# Patient Record
Sex: Female | Born: 1935
Health system: Southern US, Academic
[De-identification: ages and names within clinical notes are randomized; demographics above are authoritative.]

## PROBLEM LIST (undated history)

## (undated) ENCOUNTER — Encounter

## (undated) ENCOUNTER — Telehealth

## (undated) DIAGNOSIS — E119 Type 2 diabetes mellitus without complications: Secondary | ICD-10-CM

## (undated) DIAGNOSIS — I10 Essential (primary) hypertension: Secondary | ICD-10-CM

## (undated) HISTORY — PX: CHOLECYSTECTOMY: SHX55

## (undated) HISTORY — PX: JOINT REPLACEMENT: SHX530

---

## 2017-04-30 MED ORDER — MOMETASONE FUROATE 0.1 % EX CREA: Start: 2017-04-30 — End: 2017-05-02

## 2017-04-30 MED ORDER — ACCU-CHEK AVIVA PLUS VI STRP
ORAL_STRIP
Start: 2017-04-30 — End: ?

## 2017-04-30 MED ORDER — ACCU-CHEK FASTCLIX LANCETS MISC: Start: 2017-04-30 — End: ?

## 2017-04-30 MED ORDER — METFORMIN HCL ER 500 MG PO TB24: Start: 2017-04-30 — End: ?

## 2017-04-30 MED ORDER — METOPROLOL SUCCINATE ER 100 MG PO TB24: Start: 2017-04-30 — End: ?

## 2017-05-02 ENCOUNTER — Ambulatory Visit: Attending: Internal Medicine | Primary: Internal Medicine

## 2017-05-02 DIAGNOSIS — K580 Irritable bowel syndrome with diarrhea: Secondary | ICD-10-CM

## 2017-05-02 DIAGNOSIS — M1990 Unspecified osteoarthritis, unspecified site: Principal | ICD-10-CM

## 2017-05-02 DIAGNOSIS — I10 Essential (primary) hypertension: Secondary | ICD-10-CM

## 2017-05-02 DIAGNOSIS — R1010 Upper abdominal pain, unspecified: Principal | ICD-10-CM

## 2017-05-02 DIAGNOSIS — E119 Type 2 diabetes mellitus without complications: Secondary | ICD-10-CM

## 2017-05-02 MED ORDER — HYDROCHLOROTHIAZIDE 12.5 MG PO CAPS: Start: 2017-05-02 — End: ?

## 2017-05-02 MED ORDER — ALPRAZOLAM 0.25 MG PO TABS: Start: 2017-05-02 — End: ?

## 2017-05-10 NOTE — Progress Notes
[facility]    Patient: Peggy Wong  MRN: [id number]  DOB: 09/02/1936  PCP: Patient Care Team:  Harrell LarkSoberano, Celeste S., MD as PCP - General (Internal Medicine)      Chief Complaint   Patient presents with   ? Follow-up         Lab or Diagnostic Testing:   OSH records reviewed in MEDIA section in EMR.    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 female who is here for follow up. Last visit was 01/2016.    After last visit did well until 03/2017. No black stool until 1 week after taking upper abdomen. Had some assoc upper abd discomfort. Gaviscon helped after couple days. Had one large episode of black stool. No more after that. Now only taking Tylenol for arthritis - doesn't help.    At bedtime has had some "dyspepsia" - some nausea at bedtime. Takes more regularly than last time - nightly now.    Sometimes has some abdominal pain before urgent BM - first is more formed, then can become more and more loose. Only in AM. This is better since she has been taking VSL#3 daily.    The patient's medical records and laboratory data was reviewed.    Allergies:    She is allergic to no known allergies.    Active Problem List:  Patient Active Problem List   Diagnosis   ? Osteoarthrosis, unspecified whether generalized or localized, unspecified site   ? Type II or unspecified type diabetes mellitus without mention of complication, not stated as uncontrolled   ? Unspecified essential hypertension   ? Pain of upper abdomen   ? Irritable bowel syndrome       Past Medical History:

## 2017-05-10 NOTE — Progress Notes
explained in detail to the patient. Risks including but not limited to bleeding, perforation, adverse reaction to medications, cardiopulmonary compromise and their attendant sequelae explained. Sequelae including but not limited to need for surgery, prolonged hospital stay, placement of drainage tubes, blood transfusions, disability, deformity, morbidity, and mortality was explained. The patient indicates understanding and wishes to proceed.

## 2017-05-10 NOTE — Progress Notes
with full glass of water. Do not eat/drink 1 hr before or after. Do not lie down for next 1 hr.     ? metFORMIN (GLUCOPHAGE) 500 MG Tablet Take by mouth 2 times daily (with meals).     ? metFORMIN (GLUCOPHAGE-XR) 500 MG PO Tablet Extended Release 24 Hour      ? metoprolol succinate (TOPROL-XL) 100 MG PO Tablet Extended Release 24 Hour      ? metoprolol succinate (TOPROL-XL) 50 MG Tablet Extended Release 24 Hour Take by mouth daily.     ? rosuvastatin (CRESTOR) 5 MG Tablet Take by mouth daily.       No current facility-administered medications for this visit.        Objective:     PHYSICAL EXAM:  BP 134/76 - Pulse 73 - Temp 36.3 ?C (97.4 ?F) - Ht 1.499 m (4\' 11" ) - Wt 48.5 kg (107 lb) - BMI 21.61 kg/m2  Body mass index is 21.61 kg/(m^2).    CONSTITUTION(General Appearance) - Healthy appearing, looks stated age.  RESPIRATORY - Lungs clear to auscultation, normal respiratory effort.  CARDIOVASCULAR -  Normal heart sounds, normal abdominal aorta. Peripheral pulses present.   ABDOMEN -  Soft, nontender.  No organomegaly.  Normal bowel sounds. Non-distended.  EXTREMITIES -   No edema. Normal color.  MUSCULOSKELETAL - Normal gait and proximal muscle strength in all four extremities.  NEUROLOGICAL - No obvious deficits. Normal gait  PSYCHIATRIC - Normal mood and oriented to person place and time, normal thought pattern, no flight of ideas or pressured speech.  RECTAL: Deferred        Assessment & Plan:       ICD-10-CM ICD-9-CM   1. Pain of upper abdomen R10.10 789.09   2. Irritable bowel syndrome with diarrhea K58.0 564.1   - Hx of intestinal metaplasia of stomach - empiric treatment of H pylori in 2016    Plan:  - EGD  - Increase VSL#3 to 2 doses daily  - Add Imodium if needed to decrease urgency.  - Continue Gaviscon 2 doses at bedtime    Above plan explained in detail to the patient who understands and agrees. All questions answered    Esophagogastroduodenoscopy:  The risks, benefits and alternatives were

## 2017-05-10 NOTE — Progress Notes
Reviewed and updated in the patient's EMR  Past Medical History:   Diagnosis Date   ? Arthritis    ? Diabetes    ? HBP (high blood pressure)        Surgical History:   Past Surgical History:   Procedure Laterality Date   ? CHOLECYSTECTOMY      Surgery Description: Cholecystectomy;   (Created by Conversion)   ? DILATION AND CURETTAGE OF UTERUS      Surgery Description: Dilation And Curettage;   (Created by Conversion)   ? EYE SURGERY      Surgery Description: Cataract Surgery;   (Created by Conversion)   ? OTHER SURGICAL HISTORY      Surgery Description: Esophagogastroduodenoscopy;  Problem Comments: 09/2008 - intestinal metaplasia (Created by Conversion)   ? UPPER GASTROINTESTINAL ENDOSCOPY  2011-10    Mild erythema esophagus, small HH, mild erythema stomach, nl duo        Social History:   She  reports that she has never smoked. She has never used smokeless tobacco. She reports that she does not drink alcohol or use illicit drugs.     Family History:   Her family history includes No Known Problems in her father and mother.       REVIEW OF SYSTEMS:  CONSTITUTIONAL: Appetite good, no fevers, fatigue or weight loss, no headache   CV: No chest pain, palpitations, PND or peripheral edema   RESPIRATORY: No cough, shortness of breath, wheezing or dyspnea   GI: No nausea/vomiting,  dysphagia, heartburn   GU: No dysuria, urgency or incontinence  NEURO: No dizziness, numbness, MS changes, motor weakness, or syncope      Medications:    Objective:   Reviewed and updated in the patient's EMR.  Current Outpatient Prescriptions   Medication Sig Dispense Refill   ? ACCU-CHEK AVIVA PLUS VI Strip      ? ACCU-CHEK FASTCLIX Lancets XX      ? ALPRAZolam (XANAX) 0.25 MG PO Tablet      ? amLODIPine-Olmesartan 5-40 MG Tablet by mouth.     ? hydrochlorothiazide (HYDRODIURIL) 25 MG Tablet Take by mouth daily.     ? hydroCHLOROthiazide (MICROZIDE) 12.5 MG PO Capsule      ? ibandronate (BONIVA) 150 MG Tablet Take by mouth every 30 days. Take

## 2017-05-22 ENCOUNTER — Encounter: Attending: Internal Medicine | Primary: Internal Medicine

## 2017-05-24 ENCOUNTER — Encounter: Attending: Internal Medicine | Primary: Internal Medicine

## 2017-05-24 DIAGNOSIS — K580 Irritable bowel syndrome with diarrhea: Secondary | ICD-10-CM

## 2017-05-24 DIAGNOSIS — R1010 Upper abdominal pain, unspecified: Principal | ICD-10-CM

## 2017-06-10 MED ORDER — SERTRALINE HCL 25 MG PO TABS: Start: 2017-06-10 — End: ?

## 2017-06-10 MED ORDER — ESOMEPRAZOLE MAGNESIUM 40 MG PO CPDR
0 refills
Start: 2017-06-10 — End: 2017-06-11

## 2017-06-11 ENCOUNTER — Ambulatory Visit: Attending: Internal Medicine | Primary: Internal Medicine

## 2017-06-11 DIAGNOSIS — K2960 Other gastritis without bleeding: Principal | ICD-10-CM

## 2017-06-11 DIAGNOSIS — I10 Essential (primary) hypertension: Secondary | ICD-10-CM

## 2017-06-11 DIAGNOSIS — T39395A Adverse effect of other nonsteroidal anti-inflammatory drugs [NSAID], initial encounter: Secondary | ICD-10-CM

## 2017-06-11 DIAGNOSIS — M1990 Unspecified osteoarthritis, unspecified site: Principal | ICD-10-CM

## 2017-06-11 DIAGNOSIS — E119 Type 2 diabetes mellitus without complications: Secondary | ICD-10-CM

## 2017-06-11 MED ORDER — ESOMEPRAZOLE MAGNESIUM 40 MG PO CPDR
11 refills | Status: CP
Start: 2017-06-11 — End: 2017-08-26

## 2017-06-11 MED ORDER — GABAPENTIN 100 MG PO CAPS: Start: 2017-06-11 — End: ?

## 2017-06-15 NOTE — Progress Notes
with full glass of water. Do not eat/drink 1 hr before or after. Do not lie down for next 1 hr.     ? metFORMIN (GLUCOPHAGE-XR) 500 MG PO Tablet Extended Release 24 Hour      ? metoprolol succinate (TOPROL-XL) 100 MG PO Tablet Extended Release 24 Hour      ? rosuvastatin (CRESTOR) 5 MG Tablet Take by mouth daily.     ? sertraline (ZOLOFT) 25 MG PO Tablet        No current facility-administered medications for this visit.        Objective:     PHYSICAL EXAM:  BP 123/78  - Pulse 67  - Temp 36.3 ?C (97.3 ?F)  - Ht 1.499 m (4\' 11" )  - Wt 46.7 kg (103 lb)  - BMI 20.80 kg/m?   Body mass index is 20.8 kg/m?.    CONSTITUTION(General Appearance) - Healthy appearing, looks stated age.  EXTREMITIES -   No edema. Normal color.  MUSCULOSKELETAL - Normal gait and proximal muscle strength in all four extremities.  NEUROLOGICAL - No obvious deficits. Normal gait  PSYCHIATRIC - Normal mood and oriented to person place and time, normal thought pattern, no flight of ideas or pressured speech.  RECTAL: Deferred        Assessment & Plan:       ICD-10-CM ICD-9-CM   1. Gastritis due to nonsteroidal anti-inflammatory drug (NSAID) K29.60 535.40    T39.395A E935.8       Plan:  - Nexium daily x few more weeks - DC in July  - No more NSAIDs  - Check labs - make sure not Celiac - low suspicion (will check with labs due from PCP)      Above plan explained in detail to the patient who understands and agrees. All questions answered.    Visit time: 16 min.   Counseling time: 9 min.    Topic(s): Results, diagnosis, medications, diet, follow up

## 2017-06-15 NOTE — Progress Notes
?   HBP (high blood pressure)        Surgical History:   Past Surgical History:   Procedure Laterality Date   ? CHOLECYSTECTOMY      Surgery Description: Cholecystectomy;   (Created by Conversion)   ? DILATION AND CURETTAGE OF UTERUS      Surgery Description: Dilation And Curettage;   (Created by Conversion)   ? EYE SURGERY      Surgery Description: Cataract Surgery;   (Created by Conversion)   ? OTHER SURGICAL HISTORY      Surgery Description: Esophagogastroduodenoscopy;  Problem Comments: 09/2008 - intestinal metaplasia (Created by Conversion)   ? UPPER GASTROINTESTINAL ENDOSCOPY  2011-10    Mild erythema esophagus, small HH, mild erythema stomach, nl duo        Social History:   She  reports that she has never smoked. She has never used smokeless tobacco. She reports that she does not drink alcohol or use drugs.     Family History:   Her family history includes No Known Problems in her father and mother.       REVIEW OF SYSTEMS:  CONSTITUTIONAL: Appetite good, no fevers, fatigue or weight loss, no headache   CV: No chest pain, palpitations, PND or peripheral edema   RESPIRATORY: No cough, shortness of breath, wheezing or dyspnea   GI: No nausea/vomiting,  dysphagia, heartburn   GU: No dysuria, urgency or incontinence  NEURO: No dizziness, numbness, MS changes, motor weakness, or syncope      Medications:    Objective:   Reviewed and updated in the patient's EMR.  Current Outpatient Prescriptions   Medication Sig Dispense Refill   ? ACCU-CHEK AVIVA PLUS VI Strip      ? ACCU-CHEK FASTCLIX Lancets XX      ? ALPRAZolam (XANAX) 0.25 MG PO Tablet      ? amLODIPine-Olmesartan 5-40 MG Tablet by mouth.     ? esomeprazole (NexIUM) 40 MG PO Capsule Delayed Release TAKE 1 CAPSUEL BY MOUTH DAILY  0   ? gabapentin (NEURONTIN) 100 MG PO Capsule      ? hydroCHLOROthiazide (MICROZIDE) 12.5 MG PO Capsule      ? ibandronate (BONIVA) 150 MG Tablet Take by mouth every 30 days. Take

## 2017-06-15 NOTE — Progress Notes
[facility]    Patient: Peggy Wong  MRN: [id number]  DOB: 03/05/1936  PCP: Patient Care Team:  Harrell LarkSoberano, Celeste S., MD as PCP - General (Internal Medicine)      Chief Complaint   Patient presents with   ? Follow-up         Lab or Diagnostic Testing:   OSH records reviewed in MEDIA section in EMR.    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 female who is here for follow up. Last visit was 04/2017 for EGD.    After last visit has been doing better on Nexium. Taking 1 a day - in evening. And Gaviscon in AM. Thinks about 75% better. Has stopped Advil also.     Still has periodic diarrhea. Usually BMs in the AM. Not in the PM. First is formed, then 2nd or 3rd is looser. No problems in PM.    The patient's medical records and laboratory data was reviewed.    Allergies:    She is allergic to no known allergies.    Active Problem List:  Patient Active Problem List   Diagnosis   ? Osteoarthrosis, unspecified whether generalized or localized, unspecified site   ? Type II or unspecified type diabetes mellitus without mention of complication, not stated as uncontrolled   ? Unspecified essential hypertension   ? Pain of upper abdomen   ? Irritable bowel syndrome       Past Medical History:   Reviewed and updated in the patient's EMR  Past Medical History:   Diagnosis Date   ? Arthritis    ? Diabetes

## 2017-08-25 MED ORDER — ESOMEPRAZOLE MAGNESIUM 40 MG PO CPDR
3 refills | Status: CP
Start: 2017-08-25 — End: ?

## 2018-01-02 ENCOUNTER — Ambulatory Visit: Attending: Internal Medicine | Primary: Internal Medicine

## 2018-01-02 DIAGNOSIS — M1990 Unspecified osteoarthritis, unspecified site: Principal | ICD-10-CM

## 2018-01-02 DIAGNOSIS — K921 Melena: Principal | ICD-10-CM

## 2018-01-02 DIAGNOSIS — I10 Essential (primary) hypertension: Secondary | ICD-10-CM

## 2018-01-02 DIAGNOSIS — E119 Type 2 diabetes mellitus without complications: Secondary | ICD-10-CM

## 2018-01-02 NOTE — Progress Notes
-   Get old labs from PCP  - Check Hgb now - if lower than December check will check EGD  - If normal counts will check CBC again in 3 months  - Discussed checking capsule endoscopy if need to repeat EGD and colon and they are normal    Above plan explained in detail to the patient who understands and agrees. All questions answered.

## 2018-01-02 NOTE — Progress Notes
?   ACCU-CHEK AVIVA PLUS VI Strip      ? ACCU-CHEK FASTCLIX Lancets XX      ? ALPRAZolam (XANAX) 0.25 MG PO Tablet      ? amLODIPine-Olmesartan 5-40 MG Tablet by mouth.     ? esomeprazole (NexIUM) 40 MG PO Capsule Delayed Release TAKE 1 CAPSUEL BY MOUTH DAILY 90 capsule 3   ? gabapentin (NEURONTIN) 100 MG PO Capsule      ? hydroCHLOROthiazide (MICROZIDE) 12.5 MG PO Capsule      ? ibandronate (BONIVA) 150 MG Tablet Take by mouth every 30 days. Take with full glass of water. Do not eat/drink 1 hr before or after. Do not lie down for next 1 hr.     ? metFORMIN (GLUCOPHAGE-XR) 500 MG PO Tablet Extended Release 24 Hour      ? metoprolol succinate (TOPROL-XL) 100 MG PO Tablet Extended Release 24 Hour      ? rosuvastatin (CRESTOR) 5 MG Tablet Take by mouth daily.     ? sertraline (ZOLOFT) 25 MG PO Tablet        No current facility-administered medications for this visit.        Objective:     PHYSICAL EXAM:  BP 111/71  - Pulse 67  - Temp 36.5 ?C (97.7 ?F)  - Resp 15  - Ht 1.499 m (4\' 11" )  - Wt 50.8 kg (112 lb)  - BMI 22.62 kg/m?   Body mass index is 22.62 kg/m?.    CONSTITUTION(General Appearance) - Healthy appearing, looks stated age.  HEENT -  Normocephalic, atraumatic. No scleral icterus.   RESPIRATORY - Lungs clear to auscultation, normal respiratory effort.  CARDIOVASCULAR -  Normal heart sounds, normal abdominal aorta. Peripheral pulses present.   ABDOMEN -  Soft, nontender.  No organomegaly.  Normal bowel sounds. Non-distended.  EXTREMITIES -   No edema. Normal color.  MUSCULOSKELETAL - Normal gait and proximal muscle strength in all four extremities.  NEUROLOGICAL - No obvious deficits. Normal gait  PSYCHIATRIC - Normal mood and oriented to person place and time, normal thought pattern, no flight of ideas or pressured speech.  RECTAL: Deferred          Assessment & Plan:       ICD-10-CM ICD-9-CM   1. Black stool K92.1 792.1     - Not sure if black stool is truly melena, possible just diet related    Plan:

## 2018-01-02 NOTE — Progress Notes
[facility]    Patient: Peggy Wong  MRN: [id number]  DOB: 11/09/1936  PCP: Patient Care Team:  Harrell LarkSoberano, Celeste S., MD as PCP - General (Internal Medicine)      Chief Complaint   Patient presents with   ? Blood In Stool   ? Abdominal Pain   ? Diarrhea         Lab or Diagnostic Testing:   OSH records reviewed in MEDIA section in EMR.    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 female who is here for follow up. Last visit was 05/2017. Dx with NSAID gastritis, and plan at that time was to continue PPI for short-term and then DC if possible.    After last visit able to stop Nexium. No problems with abdominal pain. No NSAIDs. Only taking Tylenol.    Noticed 2 BMs recently. Monday 1st BM was brown, formed, then last 2 were black. No increased smell. Usually has 3 BMs/day. Last one is usually formless. Last 2 were black. Tuesday was a little dark, but not black until 3rd BM. Sometimes has to take dicyclomine for cramps. Just prn. No black stool since Tuesday. Normal color yesterday - still same consistency.     No pepto. No weird foods. Did eat spinach salad Sunday. No nausea. No weight change.    The patient's medical records and laboratory data was reviewed.    Allergies:    She is allergic to no known allergies.    Active Problem List:  Patient Active Problem List   Diagnosis

## 2018-01-02 NOTE — Progress Notes
?   Osteoarthrosis, unspecified whether generalized or localized, unspecified site   ? Type II or unspecified type diabetes mellitus without mention of complication, not stated as uncontrolled   ? Unspecified essential hypertension   ? Pain of upper abdomen   ? Irritable bowel syndrome   ? Black stool       Past Medical History:   Reviewed and updated in the patient's EMR  Past Medical History:   Diagnosis Date   ? Arthritis    ? Diabetes (CMS-HCC code)    ? HBP (high blood pressure)        Surgical History:   Past Surgical History:   Procedure Laterality Date   ? CHOLECYSTECTOMY      Surgery Description: Cholecystectomy;   (Created by Conversion)   ? DILATION AND CURETTAGE OF UTERUS      Surgery Description: Dilation And Curettage;   (Created by Conversion)   ? EYE SURGERY      Surgery Description: Cataract Surgery;   (Created by Conversion)   ? OTHER SURGICAL HISTORY      Surgery Description: Esophagogastroduodenoscopy;  Problem Comments: 09/2008 - intestinal metaplasia (Created by Conversion)   ? UPPER GASTROINTESTINAL ENDOSCOPY  2011-10    Mild erythema esophagus, small HH, mild erythema stomach, nl duo        Social History:   She  reports that she has never smoked. She has never used smokeless tobacco. She reports that she does not drink alcohol or use drugs.     Family History:   Her family history includes No Known Problems in her father and mother.       REVIEW OF SYSTEMS:  CONSTITUTIONAL: Appetite good, no fevers, fatigue or weight loss, no headache   CV: No chest pain, palpitations, PND or peripheral edema   RESPIRATORY: No cough, shortness of breath, wheezing or dyspnea   GI: No nausea/vomiting,  dysphagia, heartburn, abdominal pain, constipation  GU: No dysuria, urgency or incontinence  NEURO: No dizziness, numbness, MS changes, motor weakness, or syncope      Medications:    Objective:   Reviewed and updated in the patient's EMR.  Current Outpatient Prescriptions   Medication Sig Dispense Refill

## 2018-01-09 DIAGNOSIS — R1010 Upper abdominal pain, unspecified: Principal | ICD-10-CM

## 2018-01-09 DIAGNOSIS — K921 Melena: Secondary | ICD-10-CM

## 2018-02-25 ENCOUNTER — Ambulatory Visit: Attending: Internal Medicine | Primary: Internal Medicine

## 2018-02-25 DIAGNOSIS — M1990 Unspecified osteoarthritis, unspecified site: Principal | ICD-10-CM

## 2018-02-25 DIAGNOSIS — E119 Type 2 diabetes mellitus without complications: Secondary | ICD-10-CM

## 2018-02-25 DIAGNOSIS — K921 Melena: Principal | ICD-10-CM

## 2018-02-25 DIAGNOSIS — I10 Essential (primary) hypertension: Secondary | ICD-10-CM

## 2018-03-03 ENCOUNTER — Encounter: Attending: Internal Medicine | Primary: Internal Medicine

## 2018-04-08 ENCOUNTER — Encounter: Attending: Internal Medicine | Primary: Internal Medicine

## 2018-04-16 ENCOUNTER — Ambulatory Visit: Attending: Internal Medicine | Primary: Internal Medicine

## 2018-04-16 DIAGNOSIS — M1990 Unspecified osteoarthritis, unspecified site: Principal | ICD-10-CM

## 2018-04-16 DIAGNOSIS — E119 Type 2 diabetes mellitus without complications: Secondary | ICD-10-CM

## 2018-04-16 DIAGNOSIS — I10 Essential (primary) hypertension: Secondary | ICD-10-CM

## 2018-04-16 DIAGNOSIS — K2970 Gastritis, unspecified, without bleeding: Principal | ICD-10-CM

## 2018-04-16 DIAGNOSIS — K2990 Gastroduodenitis, unspecified, without bleeding: Secondary | ICD-10-CM

## 2018-04-16 MED ORDER — RANITIDINE HCL 150 MG PO TABS
150 mg | Freq: Two times a day (BID) | ORAL | 3 refills | Status: CP
Start: 2018-04-16 — End: 2018-10-15

## 2018-04-17 ENCOUNTER — Encounter: Attending: Internal Medicine | Primary: Internal Medicine

## 2018-10-15 ENCOUNTER — Ambulatory Visit: Attending: Internal Medicine | Primary: Internal Medicine

## 2018-10-15 DIAGNOSIS — E119 Type 2 diabetes mellitus without complications: Secondary | ICD-10-CM

## 2018-10-15 DIAGNOSIS — M1990 Unspecified osteoarthritis, unspecified site: Principal | ICD-10-CM

## 2018-10-15 DIAGNOSIS — K2990 Gastroduodenitis, unspecified, without bleeding: Secondary | ICD-10-CM

## 2018-10-15 DIAGNOSIS — I10 Essential (primary) hypertension: Secondary | ICD-10-CM

## 2018-10-15 DIAGNOSIS — K2970 Gastritis, unspecified, without bleeding: Principal | ICD-10-CM

## 2018-10-15 MED ORDER — FAMOTIDINE 20 MG PO TABS
20 mg | Freq: Two times a day (BID) | ORAL | 3 refills | Status: CP
Start: 2018-10-15 — End: ?

## 2019-06-17 ENCOUNTER — Ambulatory Visit: Attending: Internal Medicine | Primary: Internal Medicine

## 2019-06-17 DIAGNOSIS — I10 Essential (primary) hypertension: Secondary | ICD-10-CM

## 2019-06-17 DIAGNOSIS — R1013 Epigastric pain: Secondary | ICD-10-CM

## 2019-06-17 DIAGNOSIS — M1990 Unspecified osteoarthritis, unspecified site: Principal | ICD-10-CM

## 2019-06-17 DIAGNOSIS — K589 Irritable bowel syndrome without diarrhea: Principal | ICD-10-CM

## 2019-06-17 DIAGNOSIS — E119 Type 2 diabetes mellitus without complications: Secondary | ICD-10-CM

## 2019-06-17 MED ORDER — DICYCLOMINE HCL 10 MG PO CAPS
20 mg | Freq: Four times a day (QID) | ORAL | 11 refills | 20.00 days | Status: CP
Start: 2019-06-17 — End: 2019-06-17

## 2019-06-17 MED ORDER — DICYCLOMINE HCL 10 MG PO CAPS
20 mg | Freq: Four times a day (QID) | ORAL | 11 refills | 30.00000 days | Status: CP
Start: 2019-06-17 — End: ?

## 2019-08-17 ENCOUNTER — Ambulatory Visit: Attending: Internal Medicine | Primary: Internal Medicine

## 2019-08-17 DIAGNOSIS — M1990 Unspecified osteoarthritis, unspecified site: Principal | ICD-10-CM

## 2019-08-17 DIAGNOSIS — R1013 Epigastric pain: Principal | ICD-10-CM

## 2019-08-17 DIAGNOSIS — E119 Type 2 diabetes mellitus without complications: Secondary | ICD-10-CM

## 2019-08-17 DIAGNOSIS — I10 Essential (primary) hypertension: Secondary | ICD-10-CM

## 2019-12-17 ENCOUNTER — Ambulatory Visit: Payer: Medicare Other | Attending: Internal Medicine | Primary: Internal Medicine

## 2019-12-17 DIAGNOSIS — K2990 Gastroduodenitis, unspecified, without bleeding: Secondary | ICD-10-CM

## 2019-12-17 DIAGNOSIS — K2970 Gastritis, unspecified, without bleeding: Principal | ICD-10-CM

## 2019-12-17 MED ORDER — FAMOTIDINE 20 MG PO TABS
20 mg | Freq: Two times a day (BID) | ORAL | 3 refills | Status: CP
Start: 2019-12-17 — End: 2019-12-18

## 2019-12-17 MED ORDER — FAMOTIDINE 20 MG PO TABS
20 mg | Freq: Two times a day (BID) | ORAL | 3 refills | Status: CP
Start: 2019-12-17 — End: ?

## 2020-04-11 MED ORDER — FAMOTIDINE 20 MG PO TABS
3 refills | Status: CP
Start: 2020-04-11 — End: ?

## 2020-04-18 ENCOUNTER — Ambulatory Visit: Admit: 2020-04-18 | Discharge: 2020-04-18 | Attending: Internal Medicine

## 2020-04-19 MED ORDER — ESOMEPRAZOLE MAGNESIUM 40 MG PO CPDR
40 mg | Freq: Every morning | ORAL | 3 refills | 90.00 days | Status: CP
Start: 2020-04-19 — End: ?

## 2020-06-14 ENCOUNTER — Ambulatory Visit: Admit: 2020-06-14 | Discharge: 2020-06-14 | Attending: Internal Medicine

## 2020-06-14 DIAGNOSIS — K2990 Gastroduodenitis, unspecified, without bleeding: Secondary | ICD-10-CM

## 2020-06-14 DIAGNOSIS — K3189 Other diseases of stomach and duodenum: Secondary | ICD-10-CM

## 2020-06-14 DIAGNOSIS — E119 Type 2 diabetes mellitus without complications: Secondary | ICD-10-CM

## 2020-06-14 DIAGNOSIS — M1990 Unspecified osteoarthritis, unspecified site: Principal | ICD-10-CM

## 2020-06-14 DIAGNOSIS — I10 Essential (primary) hypertension: Secondary | ICD-10-CM

## 2020-06-14 DIAGNOSIS — K2970 Gastritis, unspecified, without bleeding: Principal | ICD-10-CM

## 2020-06-14 MED ORDER — OMEPRAZOLE 40 MG PO CPDR
40 mg | Freq: Every day | ORAL | 3 refills | 60.00000 days | Status: CP
Start: 2020-06-14 — End: 2020-06-14

## 2020-06-14 MED ORDER — OMEPRAZOLE 40 MG PO CPDR
40 mg | Freq: Every day | ORAL | 3 refills | 60.00000 days | Status: CP
Start: 2020-06-14 — End: ?

## 2020-08-01 ENCOUNTER — Emergency Department (HOSPITAL_BASED_OUTPATIENT_CLINIC_OR_DEPARTMENT_OTHER)
Admission: EM | Admit: 2020-08-01 | Discharge: 2020-08-01 | Disposition: A | Discharge status: Home / Self Care | Payer: Medicare Other | Attending: Emergency Medicine | Admitting: Emergency Medicine

## 2020-08-01 ENCOUNTER — Encounter (HOSPITAL_BASED_OUTPATIENT_CLINIC_OR_DEPARTMENT_OTHER): Payer: Self-pay | Admitting: *Deleted

## 2020-08-01 ENCOUNTER — Other Ambulatory Visit: Payer: Self-pay

## 2020-08-01 ENCOUNTER — Emergency Department (HOSPITAL_BASED_OUTPATIENT_CLINIC_OR_DEPARTMENT_OTHER): Payer: Medicare Other

## 2020-08-01 DIAGNOSIS — Z7984 Long term (current) use of oral hypoglycemic drugs: Secondary | ICD-10-CM | POA: Diagnosis not present

## 2020-08-01 DIAGNOSIS — M79661 Pain in right lower leg: Secondary | ICD-10-CM | POA: Insufficient documentation

## 2020-08-01 DIAGNOSIS — M549 Dorsalgia, unspecified: Secondary | ICD-10-CM | POA: Insufficient documentation

## 2020-08-01 DIAGNOSIS — I10 Essential (primary) hypertension: Secondary | ICD-10-CM | POA: Insufficient documentation

## 2020-08-01 DIAGNOSIS — Z79899 Other long term (current) drug therapy: Secondary | ICD-10-CM | POA: Diagnosis not present

## 2020-08-01 DIAGNOSIS — E119 Type 2 diabetes mellitus without complications: Secondary | ICD-10-CM | POA: Diagnosis not present

## 2020-08-01 DIAGNOSIS — R52 Pain, unspecified: Secondary | ICD-10-CM

## 2020-08-01 DIAGNOSIS — M79604 Pain in right leg: Secondary | ICD-10-CM

## 2020-08-01 HISTORY — DX: Essential (primary) hypertension: I10

## 2020-08-01 HISTORY — DX: Type 2 diabetes mellitus without complications: E11.9

## 2020-08-01 LAB — COMPREHENSIVE METABOLIC PANEL
ALT: 21 U/L (ref 0–44)
AST: 29 U/L (ref 15–41)
Albumin: 4 g/dL (ref 3.5–5.0)
Alkaline Phosphatase: 85 U/L (ref 38–126)
Anion gap: 11 (ref 5–15)
BUN: 19 mg/dL (ref 8–23)
CO2: 29 mmol/L (ref 22–32)
Calcium: 8.8 mg/dL — ABNORMAL LOW (ref 8.9–10.3)
Chloride: 93 mmol/L — ABNORMAL LOW (ref 98–111)
Creatinine, Ser: 0.65 mg/dL (ref 0.44–1.00)
GFR calc Af Amer: >60 mL/min (ref >60)
GFR calc non Af Amer: >60 mL/min (ref >60)
Glucose, Bld: 110 mg/dL — ABNORMAL HIGH (ref 70–99)
Potassium: 3.3 mmol/L — ABNORMAL LOW (ref 3.5–5.1)
Sodium: 133 mmol/L — ABNORMAL LOW (ref 135–145)
Total Bilirubin: 0.4 mg/dL (ref 0.3–1.2)
Total Protein: 7.8 g/dL (ref 6.5–8.1)

## 2020-08-01 LAB — CBC WITH DIFFERENTIAL/PLATELET
Abs Immature Granulocytes: 0.03 10*3/uL (ref 0.00–0.07)
Basophils Absolute: 0.1 10*3/uL (ref 0.0–0.1)
Basophils Relative: 1 %
Eosinophils Absolute: 0.2 10*3/uL (ref 0.0–0.5)
Eosinophils Relative: 3 %
HCT: 37.4 % (ref 36.0–46.0)
Hemoglobin: 12.6 g/dL (ref 12.0–15.0)
Immature Granulocytes: 0 %
Lymphocytes Relative: 19 %
Lymphs Abs: 1.4 10*3/uL (ref 0.7–4.0)
MCH: 28.6 pg (ref 26.0–34.0)
MCHC: 33.7 g/dL (ref 30.0–36.0)
MCV: 85 fL (ref 80.0–100.0)
Monocytes Absolute: 0.6 10*3/uL (ref 0.1–1.0)
Monocytes Relative: 8 %
Neutro Abs: 4.9 10*3/uL (ref 1.7–7.7)
Neutrophils Relative %: 69 %
Platelets: 231 10*3/uL (ref 150–400)
RBC: 4.4 MIL/uL (ref 3.87–5.11)
RDW: 14.4 % (ref 11.5–15.5)
WBC: 7.2 10*3/uL (ref 4.0–10.5)
nRBC: 0 % (ref 0.0–0.2)

## 2020-08-01 MED ORDER — POTASSIUM CHLORIDE CRYS ER 20 MEQ PO TBCR
40.0000 meq | EXTENDED_RELEASE_TABLET | Freq: Once | ORAL | Status: AC
  Administered 2020-08-01: 40 meq via ORAL
  Filled 2020-08-01: qty 2

## 2020-08-01 MED ORDER — POTASSIUM CHLORIDE CRYS ER 20 MEQ PO TBCR: 10.0000 meq | EXTENDED_RELEASE_TABLET | Freq: Once | ORAL | Status: DC

## 2020-08-01 NOTE — ED Provider Notes (Signed)
[facility] EMERGENCY DEPARTMENT Provider Note   CSN: [id number] Arrival date & time: 08/01/20  0745     History Chief Complaint  Patient presents with  . Leg Pain    Catherine Miranda is a 84 y.o. female.  84 y/o female with a past medical history of hypertension with renal stenosis, diabetes, gastritis presents today for diffuse bilateral throbbing leg pain right more than left that started yesterday afternoon. States significant pain on right leg with pain with minimal left leg pain. Pain associated with swelling more prominent on the right. She did not take any medications for pain, states aspirin causes dark stools and abdominal pain. This morning she woke up with minimal pain and swelling and is feeling much better. States she recently travelled from [place] on Saturday and is concerned she has a DVT. She has no history of DVT. She denies fever, chest pain, sob, wheezing, nausea, vomiting, weakness, lightheadedness, numbness tingling, or headache. She denies cancer history and does not smoke or drink alcohol.         Past Medical History:  Diagnosis Date  . Diabetes mellitus without complication (HCC)   . Hypertension     There are no problems to display for this patient.   Past Surgical History:  Procedure Laterality Date  . CHOLECYSTECTOMY    . JOINT REPLACEMENT       OB History    Gravida  4   Para  4   Term      Preterm      AB      Living        SAB      TAB      Ectopic      Multiple      Live Births              Family History  Problem Relation Age of Onset  . Pneumonia Mother     Social History   Tobacco Use  . Smoking status: Never Smoker  . Smokeless tobacco: Never Used  Substance Use Topics  . Alcohol use: Never  . Drug use: Never    Home Medications Prior to Admission medications   Medication Sig Start Date End Date Taking? Authorizing Provider  amLODipine (NORVASC) 5 MG tablet Take 5 mg by mouth daily.   Yes  [provider]  carvedilol (COREG) 25 MG tablet Take 25 mg by mouth 2 (two) times daily with a meal.   Yes [provider]  hydrochlorothiazide (HYDRODIURIL) 25 MG tablet Take 25 mg by mouth daily.   Yes [provider]  metFORMIN (GLUCOPHAGE) 500 MG tablet Take by mouth 2 (two) times daily with a meal.   Yes [provider]  olmesartan (BENICAR) 40 MG tablet Take 40 mg by mouth daily.   Yes [provider]    Allergies    Patient has no allergy information on record.  Review of Systems   Review of Systems  Constitutional: Negative for chills and fever.  HENT: Negative for ear pain and sore throat.   Eyes: Negative for discharge and redness.  Respiratory: Negative for shortness of breath and wheezing.   Cardiovascular: Positive for leg swelling. Negative for chest pain.  Gastrointestinal: Negative for abdominal distention, abdominal pain, nausea and vomiting.  Endocrine: Negative for polydipsia and polyuria.  Genitourinary: Negative for dysuria, flank pain and urgency.  Musculoskeletal: Positive for back pain. Negative for joint swelling.  Neurological: Negative for dizziness, weakness, light-headedness, numbness and headaches.  Psychiatric/Behavioral: Negative for agitation. The patient is not nervous/anxious.     Physical Exam Updated Vital Signs BP (!) 149/73 (BP Location: Right Arm)   Pulse 73   Temp 98.1 F (36.7 C) (Oral)   Resp 16   Ht 4\' 11"  (1.499 m)   Wt 46.9 kg   SpO2 95%   BMI 20.90 kg/m   Physical Exam Constitutional:      Appearance: Normal appearance. She is normal weight.  HENT:     Head: Normocephalic and atraumatic.     Right Ear: Tympanic membrane normal.     Left Ear: Tympanic membrane normal.     Nose: Nose normal.     Mouth/Throat:     Mouth: Mucous membranes are moist.     Pharynx: Oropharynx is clear.  Eyes:     Extraocular Movements: Extraocular movements intact.     Conjunctiva/sclera:  Conjunctivae normal.     Pupils: Pupils are equal, round, and reactive to light.  Cardiovascular:     Rate and Rhythm: Normal rate and regular rhythm.     Pulses: Normal pulses.     Heart sounds: Normal heart sounds.     Comments: Dorsalis pedia, posterior tibial  pulses 3+ symmetric, trace pretibial edema bilaterally Pulmonary:     Effort: Pulmonary effort is normal.     Breath sounds: Normal breath sounds. No wheezing.  Abdominal:     General: Abdomen is flat. Bowel sounds are normal.     Palpations: Abdomen is soft.  Musculoskeletal:        General: No tenderness or deformity.     Cervical back: Normal range of motion and neck supple.     Right lower leg: Edema present.     Left lower leg: Edema present.  Skin:    General: Skin is warm and dry.     Capillary Refill: Capillary refill takes less than 2 seconds.  Neurological:     General: No focal deficit present.     Mental Status: She is alert and oriented to person, place, and time. Mental status is at baseline.     Sensory: No sensory deficit.     Motor: No weakness.  Psychiatric:        Mood and Affect: Mood normal.        Behavior: Behavior normal.     ED Results / Procedures / Treatments   Labs (all labs ordered are listed, but only abnormal results are displayed) Labs Reviewed  COMPREHENSIVE METABOLIC PANEL - Abnormal; Notable for the following components:      Result Value   Sodium 133 (*)    Potassium 3.3 (*)    Chloride 93 (*)    Glucose, Bld 110 (*)    Calcium 8.8 (*)    All other components within normal limits  CBC WITH DIFFERENTIAL/PLATELET    EKG None  Radiology Venous Img Lower Bilateral (DVT)  Result Date: 08/01/2020 CLINICAL DATA:  84 year old female with a history of leg pain EXAM: BILATERAL LOWER EXTREMITY VENOUS DOPPLER ULTRASOUND TECHNIQUE: Gray-scale sonography with graded compression, as well as color Doppler and duplex ultrasound were performed to evaluate the lower extremity deep  venous systems from the level of the common femoral vein and including the common femoral, femoral, profunda femoral, popliteal and calf veins including the posterior tibial, peroneal and gastrocnemius veins when visible. The superficial great saphenous vein was also interrogated. Spectral Doppler was utilized to evaluate flow at rest and with distal augmentation maneuvers in the common  femoral, femoral and popliteal veins. COMPARISON:  None. FINDINGS: RIGHT LOWER EXTREMITY Common Femoral Vein: No evidence of thrombus. Normal compressibility, respiratory phasicity and response to augmentation. Saphenofemoral Junction: No evidence of thrombus. Normal compressibility and flow on color Doppler imaging. Profunda Femoral Vein: No evidence of thrombus. Normal compressibility and flow on color Doppler imaging. Femoral Vein: No evidence of thrombus. Normal compressibility, respiratory phasicity and response to augmentation. Popliteal Vein: No evidence of thrombus. Normal compressibility, respiratory phasicity and response to augmentation. Calf Veins: No evidence of thrombus. Normal compressibility and flow on color Doppler imaging. Superficial Great Saphenous Vein: No evidence of thrombus. Normal compressibility and flow on color Doppler imaging. Other Findings:  None. LEFT LOWER EXTREMITY Common Femoral Vein: No evidence of thrombus. Normal compressibility, respiratory phasicity and response to augmentation. Saphenofemoral Junction: No evidence of thrombus. Normal compressibility and flow on color Doppler imaging. Profunda Femoral Vein: No evidence of thrombus. Normal compressibility and flow on color Doppler imaging. Femoral Vein: No evidence of thrombus. Normal compressibility, respiratory phasicity and response to augmentation. Popliteal Vein: No evidence of thrombus. Normal compressibility, respiratory phasicity and response to augmentation. Calf Veins: No evidence of thrombus. Normal compressibility and flow on color  Doppler imaging. Superficial Great Saphenous Vein: No evidence of thrombus. Normal compressibility and flow on color Doppler imaging. Other Findings:  None. IMPRESSION: Sonographic survey of the bilateral lower extremities negative for DVT Electronically Signed   By: Gilmer Mor D.O.   On: 08/01/2020 10:00    Procedures Procedures (including critical care time)  Medications Ordered in ED Medications  potassium chloride SA (KLOR-CON) CR tablet 40 mEq (has no administration in time range)    ED Course  I have reviewed the triage vital signs and the nursing notes.  Pertinent labs & imaging results that were available during my care of the patient were reviewed by me and considered in my medical decision making (see chart for details).  Clinical Course as of Aug 01 1029  Mon Aug 01, 2020  1027 Patient rechecked and continues to have no symptoms. Explained lab and [place] results and plan for discharge. Patient understands and is agreeable.   [JL]    Clinical Course User Index [JL] Quincy Simmonds, MD   MDM Rules/Calculators/A&P                          84 y/o female present with swelling and pain of RLE>LLEafter recent car travel concerning for DVT. Stable vitals no symptoms of SOB or chest pain concerning for PE. [place] negative for DVT. CB unremarkable CMP with mild hypokalemia of 3.3 treated with oral potassium. Likely musculoskeletal pain.  Final Clinical Impression(s) / ED Diagnoses Final diagnoses:  Right leg pain  Right calf pain    Rx / DC Orders ED Discharge Orders    None       Quincy Simmonds, MD 08/01/20 1030    Alvira Monday, MD 08/02/20 [phone]

## 2020-08-01 NOTE — ED Triage Notes (Signed)
Bilateral leg pain but right leg is worst than the left leg.  Arrived from [place] Saturday evening and the pain was worst Sunday night.

## 2020-08-01 NOTE — Discharge Instructions (Signed)
Follow up with your PCP as needed if you continue to have leg pain and swelling

## 2021-02-15 ENCOUNTER — Ambulatory Visit: Payer: Medicare Other | Attending: Internal Medicine | Primary: Internal Medicine

## 2021-02-15 DIAGNOSIS — K2970 Gastritis, unspecified, without bleeding: Principal | ICD-10-CM

## 2021-02-15 DIAGNOSIS — K31A Gastritis with intestinal metaplasia of stomach: Secondary | ICD-10-CM

## 2021-03-17 ENCOUNTER — Encounter: Payer: Medicare Other | Attending: Internal Medicine | Primary: Internal Medicine

## 2021-06-02 IMAGING — US US EXTREM LOW VENOUS
1 series · 13 of 24 positions shown · non-contrast
Comparison: None.

CLINICAL DATA: 84-year-old female with a history of leg pain



[Series 1: us extrem low venous · 13 of 51 slices shown]
[im 1/51]
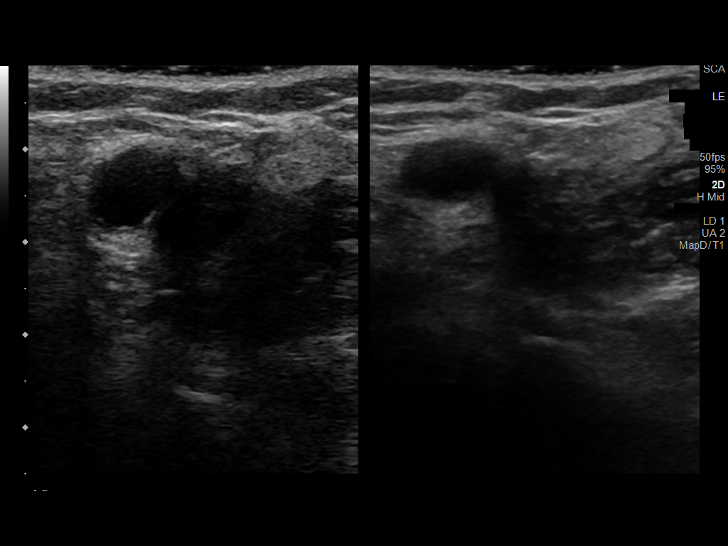
[im 5/51]
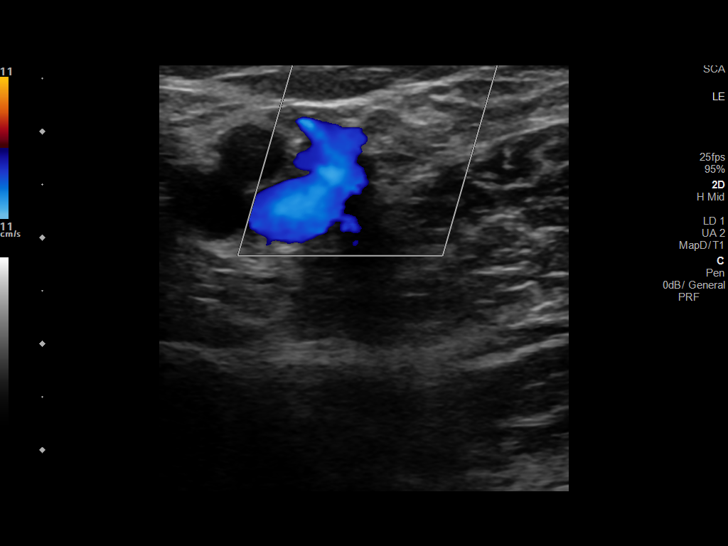
[im 9/51]
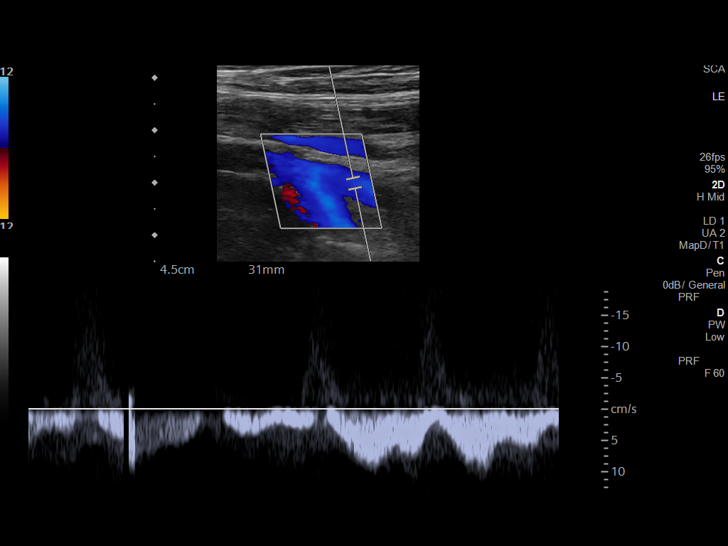
[im 14/51]
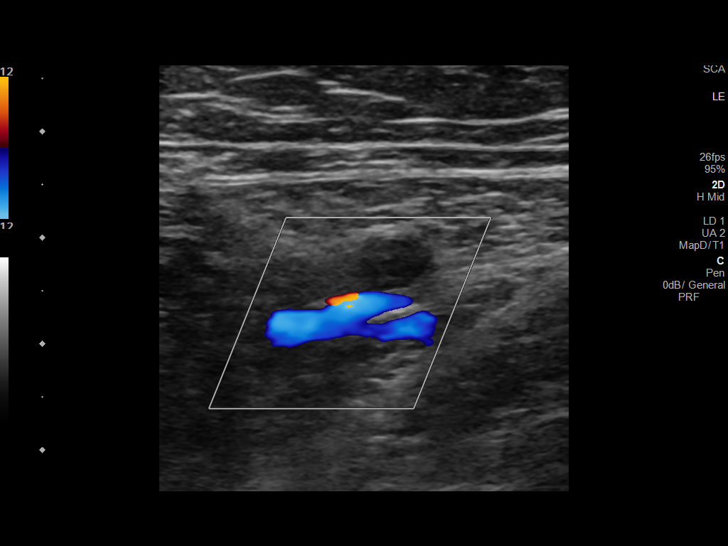
[im 18/51]
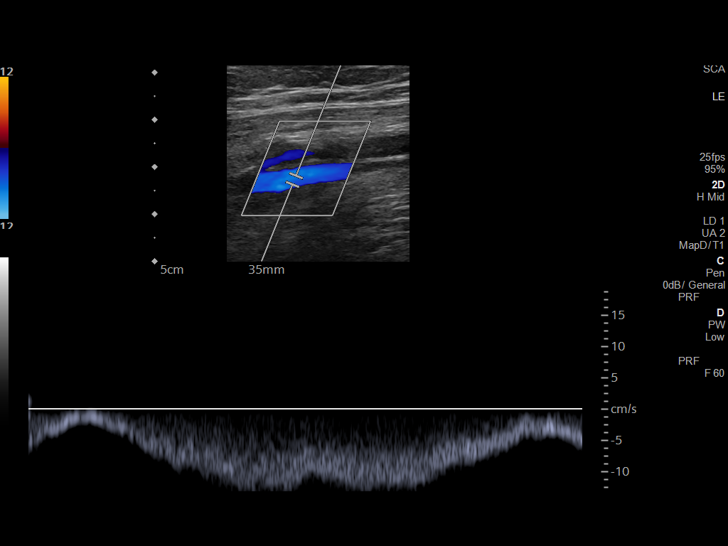
[im 22/51]
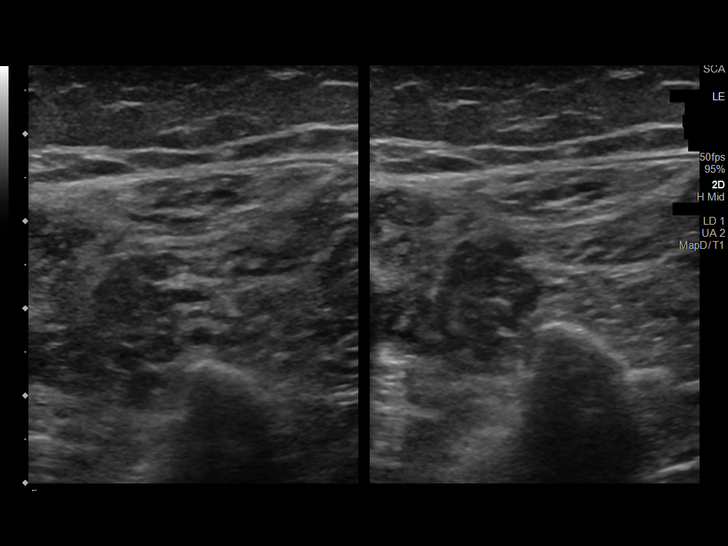
[im 27/51]
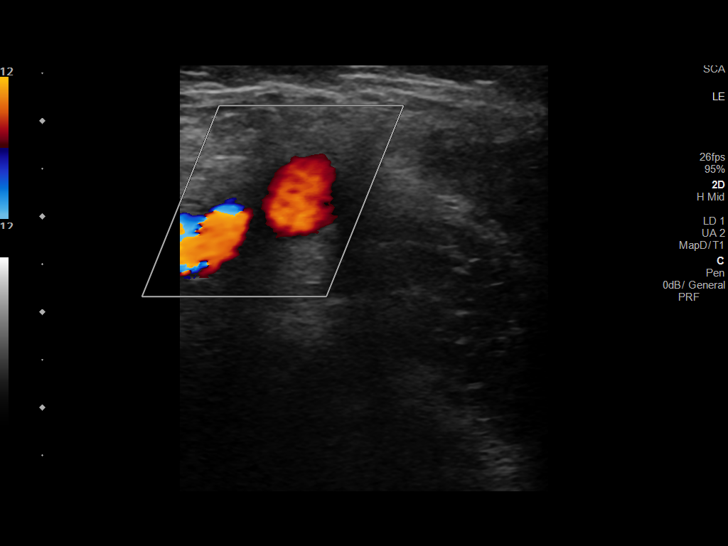
[im 29/51]
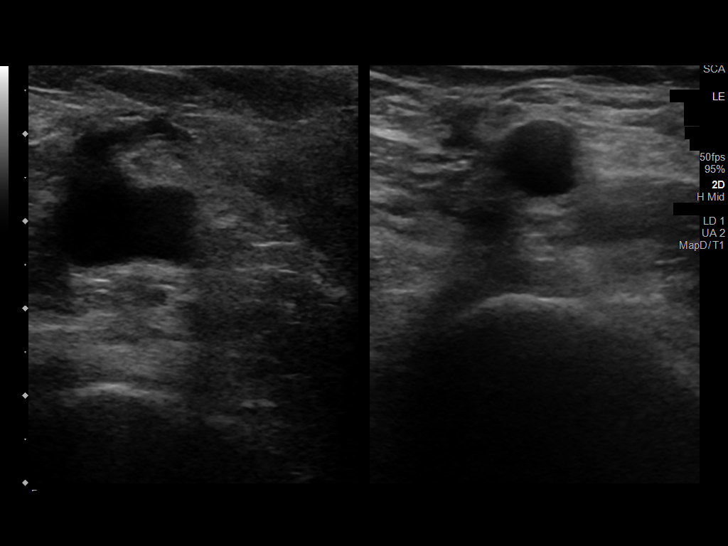
[im 33/51]
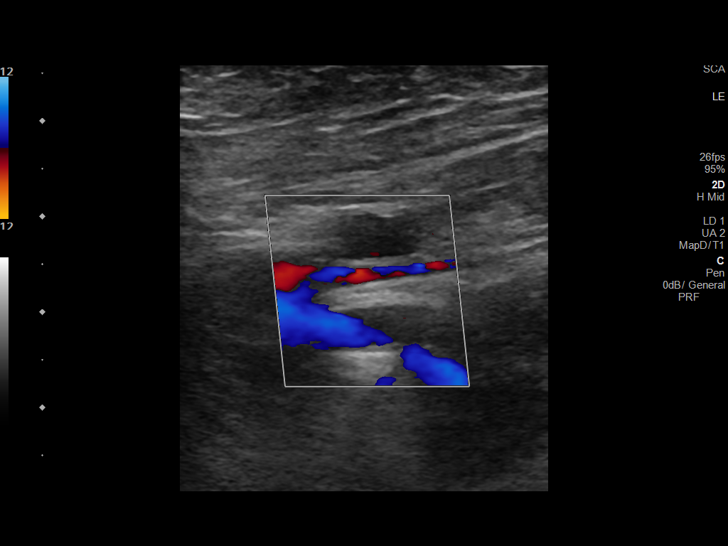
[im 37/51]
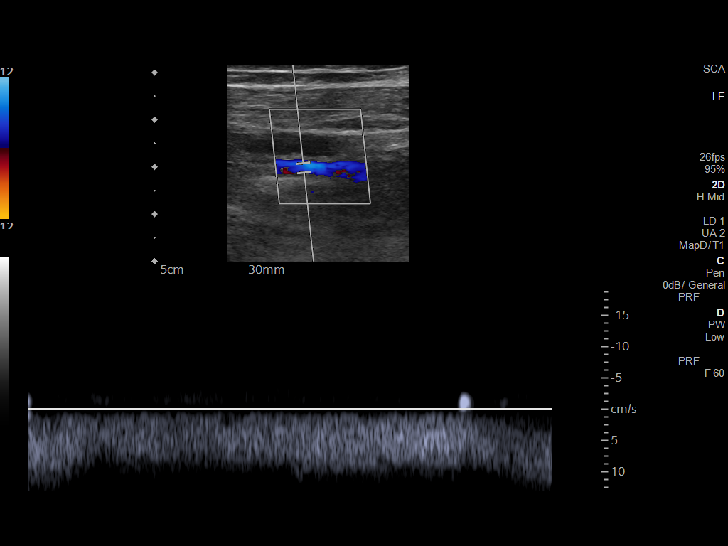
[im 42/51]
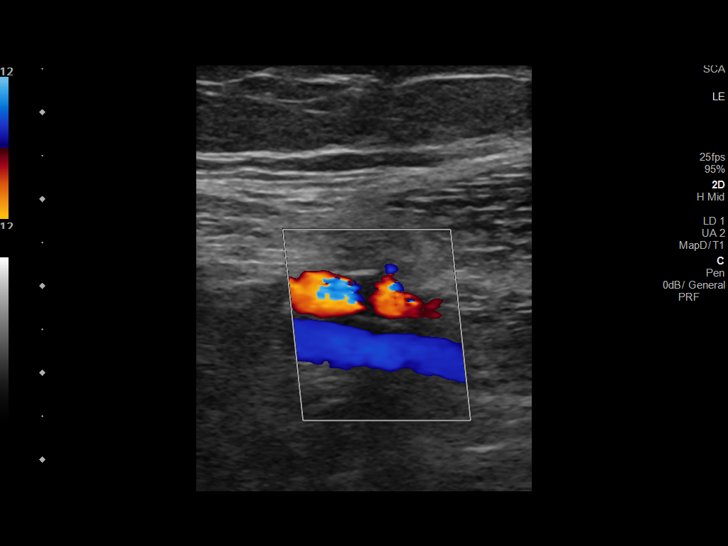
[im 46/51]
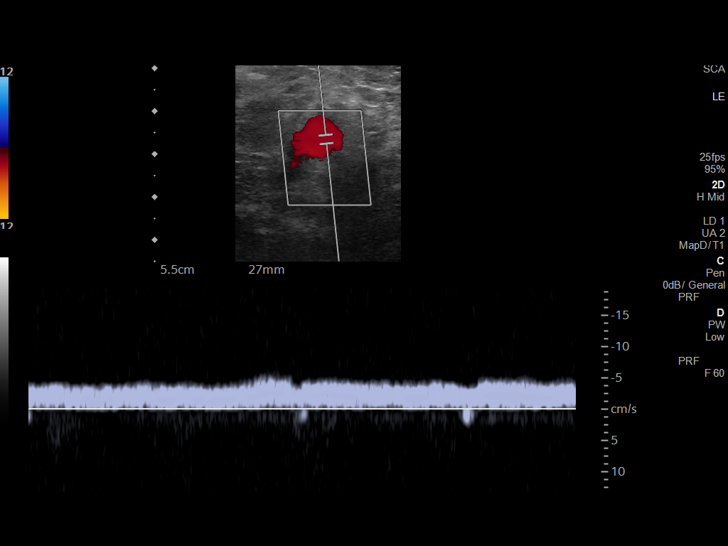
[im 51/51]
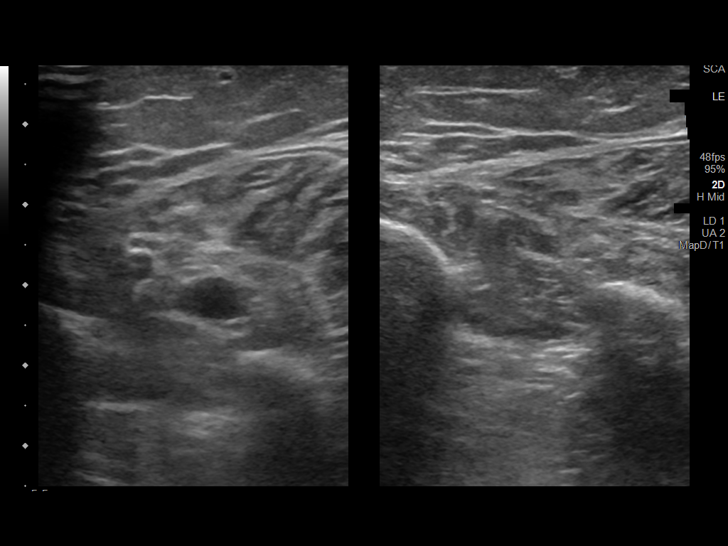

[13 of 24 positions shown; findings below may reference images not displayed]

FINDINGS: RIGHT LOWER EXTREMITY

Common Femoral Vein: No evidence of thrombus. Normal
compressibility, respiratory phasicity and response to augmentation.

Saphenofemoral Junction: No evidence of thrombus. Normal
compressibility and flow on color Doppler imaging.

Profunda Femoral Vein: No evidence of thrombus. Normal
compressibility and flow on color Doppler imaging.

Femoral Vein: No evidence of thrombus. Normal compressibility,
respiratory phasicity and response to augmentation.

Popliteal Vein: No evidence of thrombus. Normal compressibility,
respiratory phasicity and response to augmentation.

Calf Veins: No evidence of thrombus. Normal compressibility and flow
on color Doppler imaging.

Superficial Great Saphenous Vein: No evidence of thrombus. Normal
compressibility and flow on color Doppler imaging.

Other Findings:  None.

LEFT LOWER EXTREMITY

Common Femoral Vein: No evidence of thrombus. Normal
compressibility, respiratory phasicity and response to augmentation.

Saphenofemoral Junction: No evidence of thrombus. Normal
compressibility and flow on color Doppler imaging.

Profunda Femoral Vein: No evidence of thrombus. Normal
compressibility and flow on color Doppler imaging.

Femoral Vein: No evidence of thrombus. Normal compressibility,
respiratory phasicity and response to augmentation.

Popliteal Vein: No evidence of thrombus. Normal compressibility,
respiratory phasicity and response to augmentation.

Calf Veins: No evidence of thrombus. Normal compressibility and flow
on color Doppler imaging.

Superficial Great Saphenous Vein: No evidence of thrombus. Normal
compressibility and flow on color Doppler imaging.

Other Findings:  None.
IMPRESSION: Sonographic survey of the bilateral lower extremities negative for
DVT

## 2021-08-21 ENCOUNTER — Ambulatory Visit: Payer: Medicare Other | Attending: Internal Medicine | Primary: Internal Medicine

## 2021-08-21 DIAGNOSIS — K2990 Gastroduodenitis, unspecified, without bleeding: Secondary | ICD-10-CM

## 2021-08-21 DIAGNOSIS — M1990 Unspecified osteoarthritis, unspecified site: Principal | ICD-10-CM

## 2021-08-21 DIAGNOSIS — E119 Type 2 diabetes mellitus without complications: Secondary | ICD-10-CM

## 2021-08-21 DIAGNOSIS — I10 Essential (primary) hypertension: Secondary | ICD-10-CM

## 2021-08-21 DIAGNOSIS — K2970 Gastritis, unspecified, without bleeding: Principal | ICD-10-CM

## 2021-08-21 MED ORDER — FAMOTIDINE 40 MG PO TABS
40 mg | Freq: Every day | ORAL | 3 refills | Status: CP
Start: 2021-08-21 — End: ?

## 2021-11-14 ENCOUNTER — Ambulatory Visit: Payer: Medicare Other | Attending: Dermatology | Primary: Internal Medicine

## 2021-11-14 DIAGNOSIS — I10 Essential (primary) hypertension: Secondary | ICD-10-CM

## 2021-11-14 DIAGNOSIS — M1990 Unspecified osteoarthritis, unspecified site: Principal | ICD-10-CM

## 2021-11-14 DIAGNOSIS — E119 Type 2 diabetes mellitus without complications: Secondary | ICD-10-CM

## 2021-11-14 DIAGNOSIS — L299 Pruritus, unspecified: Principal | ICD-10-CM

## 2021-11-14 MED ORDER — KETOCONAZOLE 2 % EX SHAM
5 refills | Status: CP
Start: 2021-11-14 — End: ?

## 2021-11-14 MED ORDER — CLOBETASOL PROPIONATE 0.05 % EX SOLN
TOPICAL | 2 refills | 24.00000 days | Status: CP
Start: 2021-11-14 — End: ?

## 2022-03-27 ENCOUNTER — Ambulatory Visit: Payer: Medicare Other | Attending: Internal Medicine | Primary: Internal Medicine

## 2022-03-27 DIAGNOSIS — K31A Gastritis with intestinal metaplasia of stomach: Secondary | ICD-10-CM

## 2022-03-27 DIAGNOSIS — K580 Irritable bowel syndrome with diarrhea: Secondary | ICD-10-CM

## 2022-03-27 DIAGNOSIS — K2970 Gastritis, unspecified, without bleeding: Principal | ICD-10-CM

## 2022-03-27 MED ORDER — CHOLECALCIFEROL 50 MCG (2000 UT) PO TABS
50 ug | ORAL
Start: 2022-03-27 — End: ?

## 2022-03-27 MED ORDER — AMLODIPINE BESYLATE 5 MG PO TABS: Start: 2022-03-27 — End: ?

## 2022-03-27 MED ORDER — CITALOPRAM HYDROBROMIDE 10 MG PO TABS: Start: 2022-03-27 — End: ?

## 2022-03-27 MED ORDER — TIZANIDINE HCL 4 MG PO TABS: Start: 2022-03-27 — End: ?

## 2022-03-27 MED ORDER — HYOSCYAMINE SULFATE 0.125 MG SL SUBL
125 ug | ORAL | 2 refills | Status: CP | PRN
Start: 2022-03-27 — End: ?

## 2022-03-27 MED ORDER — DORZOLAMIDE HCL-TIMOLOL MAL PF 2-0.5 % OP SOLN
1 [drp] | Freq: Two times a day (BID) | OPHTHALMIC
Start: 2022-03-27 — End: ?

## 2022-03-27 MED ORDER — GABAPENTIN 300 MG PO CAPS
Freq: Three times a day (TID)
Start: 2022-03-27 — End: ?

## 2022-03-27 MED ORDER — CARVEDILOL 12.5 MG PO TABS
Freq: Two times a day (BID)
Start: 2022-03-27 — End: ?

## 2022-05-15 ENCOUNTER — Encounter: Payer: Medicare Other | Attending: Dermatology | Primary: Internal Medicine

## 2022-07-04 MED ORDER — FAMOTIDINE 40 MG PO TABS
3 refills | Status: CP
Start: 2022-07-04 — End: ?

## 2022-07-09 ENCOUNTER — Ambulatory Visit: Payer: Medicare Other | Attending: Internal Medicine | Primary: Internal Medicine

## 2022-07-09 DIAGNOSIS — R1010 Upper abdominal pain, unspecified: Secondary | ICD-10-CM

## 2022-07-09 DIAGNOSIS — K31A Gastritis with intestinal metaplasia of stomach: Secondary | ICD-10-CM

## 2022-07-09 DIAGNOSIS — K580 Irritable bowel syndrome with diarrhea: Secondary | ICD-10-CM

## 2022-07-09 DIAGNOSIS — K2970 Gastritis, unspecified, without bleeding: Secondary | ICD-10-CM

## 2022-07-09 DIAGNOSIS — K921 Melena: Principal | ICD-10-CM

## 2022-07-18 ENCOUNTER — Encounter: Payer: Medicare Other | Attending: Internal Medicine | Primary: Internal Medicine

## 2022-08-21 ENCOUNTER — Encounter: Payer: Medicare Other | Attending: Internal Medicine | Primary: Internal Medicine
